# Patient Record
Sex: Male | Born: 1970 | Race: White | Hispanic: No | Marital: Married | Smoking: Never smoker
Health system: Southern US, Community
[De-identification: ages and names within clinical notes are randomized; demographics above are authoritative.]

## PROBLEM LIST (undated history)

## (undated) DIAGNOSIS — Z889 Allergy status to unspecified drugs, medicaments and biological substances status: Secondary | ICD-10-CM

## (undated) HISTORY — DX: Allergy status to unspecified drugs, medicaments and biological substances: Z88.9

---

## 2013-03-27 ENCOUNTER — Ambulatory Visit: Payer: Self-pay | Admitting: Unknown Physician Specialty

## 2013-04-30 ENCOUNTER — Encounter: Payer: Self-pay | Admitting: Pulmonary Disease

## 2013-04-30 ENCOUNTER — Ambulatory Visit (INDEPENDENT_AMBULATORY_CARE_PROVIDER_SITE_OTHER): Payer: BC Managed Care – PPO | Admitting: Pulmonary Disease

## 2013-04-30 VITALS — BP 120/80 | HR 79 | Temp 98.6°F | Ht 72.0 in | Wt 271.0 lb

## 2013-04-30 DIAGNOSIS — R05 Cough: Secondary | ICD-10-CM

## 2013-04-30 NOTE — Patient Instructions (Signed)
When you get a cough: -take Delsym to help suppress the cough -take Chlortrimeton to help with the itchy sensation in your throat -take decongestants like phenylephrine or pseudophed to help with the nasal congestion, post nasal drip -use warm beverages, hard candies to help sooth your throat -elevate the head of your bed -follow the gastric reflux lifestyle modification sheet we gave you -try using an over the counter antacid like Pepcid  We can see you back as needed

## 2013-04-30 NOTE — Assessment & Plan Note (Signed)
Fortunately Ian Pratt's cough has resolved with conservative therapy.  I'm not really sure if the inhaled corticosteroids helped or not, especially since I see little evidence of asthma.    It seems likely that his cough is exacerbated by ongoing post nasal drip and I strongly suspect acid reflux since he is overweight and the cough is worse when he lies flat.  He likely also has a component of cyclical cough as well.  Plan: -when next cough develops, use hard candies, warm beverages, and cough suppressants immediately to sooth the throat and prevent cyclical cough -GERD lifestyle modification, weight loss -f/u with me prn

## 2013-04-30 NOTE — Progress Notes (Signed)
Subjective:    Patient ID: Ian Pratt, male    DOB: Jul 14, 1971, 42 y.o.   MRN: [id number]  HPI  42 y/o male with a past medical history significant for allergic rhinitis on allergy shots he comes to our clinic today for evaluation of cough. He states that as a child he had no respiratory problems. He previously used smokeless tobacco but has never smoked. He quit using smokeless tobacco over one year ago. He is not taking medications. On 2 separate occasions in the last year he has developed an upper respiratory infection after heavy dust exposure. This is lead to significant postnasal drip and hoarseness and cough which has lasted for quite some time. He's been diagnosis of bronchitis on both occasions and treated with antibiotics and/or prednisone. Most recently he was treated with an inhaled corticosteroid. The cough apparently lasted for 4-6 weeks and now has completely resolved. He says he comes her today to "just get checked out to make sure his lungs are OK". He states that he does not have acid reflux and when he is doing well he doesn't have significant cough or postnasal drip. When the cough was bad it was dry and was significantly worse when he was lying flat. Is associated with increased postnasal drip. It did not change after eating. Tussionex helped quite a bit.     Past Medical History  Diagnosis Date  . Multiple allergies      No family history on file.   History   Social History  . Marital Status: Married    Spouse Name: N/A    Number of Children: N/A  . Years of Education: N/A   Occupational History  . Engineer, structural    Social History Main Topics  . Smoking status: Never Smoker   . Smokeless tobacco: Never Used  . Alcohol Use: No  . Drug Use: No  . Sexually Active: Not on file   Other Topics Concern  . Not on file   Social History Narrative  . No narrative on file     No Known Allergies   No outpatient prescriptions prior to visit.   No  facility-administered medications prior to visit.      Review of Systems  Constitutional: Negative for fever, chills, activity change and appetite change.  HENT: Negative for hearing loss, ear pain, congestion, rhinorrhea, sneezing, neck pain, neck stiffness, postnasal drip and sinus pressure.   Eyes: Negative for redness, itching and visual disturbance.  Respiratory: Positive for cough. Negative for chest tightness, shortness of breath and wheezing.   Cardiovascular: Negative for chest pain, palpitations and leg swelling.  Gastrointestinal: Negative for nausea, vomiting, abdominal pain, diarrhea, constipation, blood in stool and abdominal distention.  Musculoskeletal: Negative for myalgias, joint swelling, arthralgias and gait problem.  Skin: Negative for rash.  Neurological: Negative for dizziness, light-headedness, numbness and headaches.  Hematological: Does not bruise/bleed easily.  Psychiatric/Behavioral: Negative for confusion and dysphoric mood.       Objective:   Physical Exam Filed Vitals:   04/30/13 1406  BP: 120/80  Pulse: 79  Temp: 98.6 F (37 C)  TempSrc: Oral  Height: 6' (1.829 m)  Weight: 271 lb (122.925 kg)  SpO2: 98%   Gen: overweight but well appearing, no acute distress HEENT: NCAT, PERRL, EOMi, OP clear, neck supple without masses PULM: CTA B CV: RRR, no mgr, no JVD AB: BS+, soft, nontender, no hsm Ext: warm, no edema, no clubbing, no cyanosis Derm: no rash or skin breakdown Neuro: A&Ox4,  CN II-XII intact, strength 5/5 in all 4 extremities   04/30/2013 Spirometry > no obstruction, FVC low 03/27/2013 CXR > normal     Assessment & Plan:   Cough Fortunately Tramain's cough has resolved with conservative therapy.  I'm not really sure if the inhaled corticosteroids helped or not, especially since I see little evidence of asthma.    It seems likely that his cough is exacerbated by ongoing post nasal drip and I strongly suspect acid reflux since he is  overweight and the cough is worse when he lies flat.  He likely also has a component of cyclical cough as well.  Plan: -when next cough develops, use hard candies, warm beverages, and cough suppressants immediately to sooth the throat and prevent cyclical cough -GERD lifestyle modification, weight loss -f/u with me prn   Updated Medication List Outpatient Encounter Prescriptions as of 04/30/2013  Medication Sig Dispense Refill  . cetirizine (ZYRTEC) 10 MG tablet Take 10 mg by mouth daily.      . Flunisolide HFA (AEROSPAN) 80 MCG/ACT AERS Inhale 2 sprays into the lungs 2 (two) times daily.      . fluticasone (FLONASE) 50 MCG/ACT nasal spray Place 2 sprays into the nose daily.      Marland Kitchen UNABLE TO FIND Med Name: Allergy inj once per wk       No facility-administered encounter medications on file as of 04/30/2013.

## 2014-07-23 IMAGING — CR DG CHEST 2V
1 series · 2 of 2 positions shown · non-contrast
Comparison: none

REASON FOR EXAM: cough
COMMENTS:

[Series 1: w chest pa · 0.14mm/px · 2 of 2 slices shown]
[im 1/2]
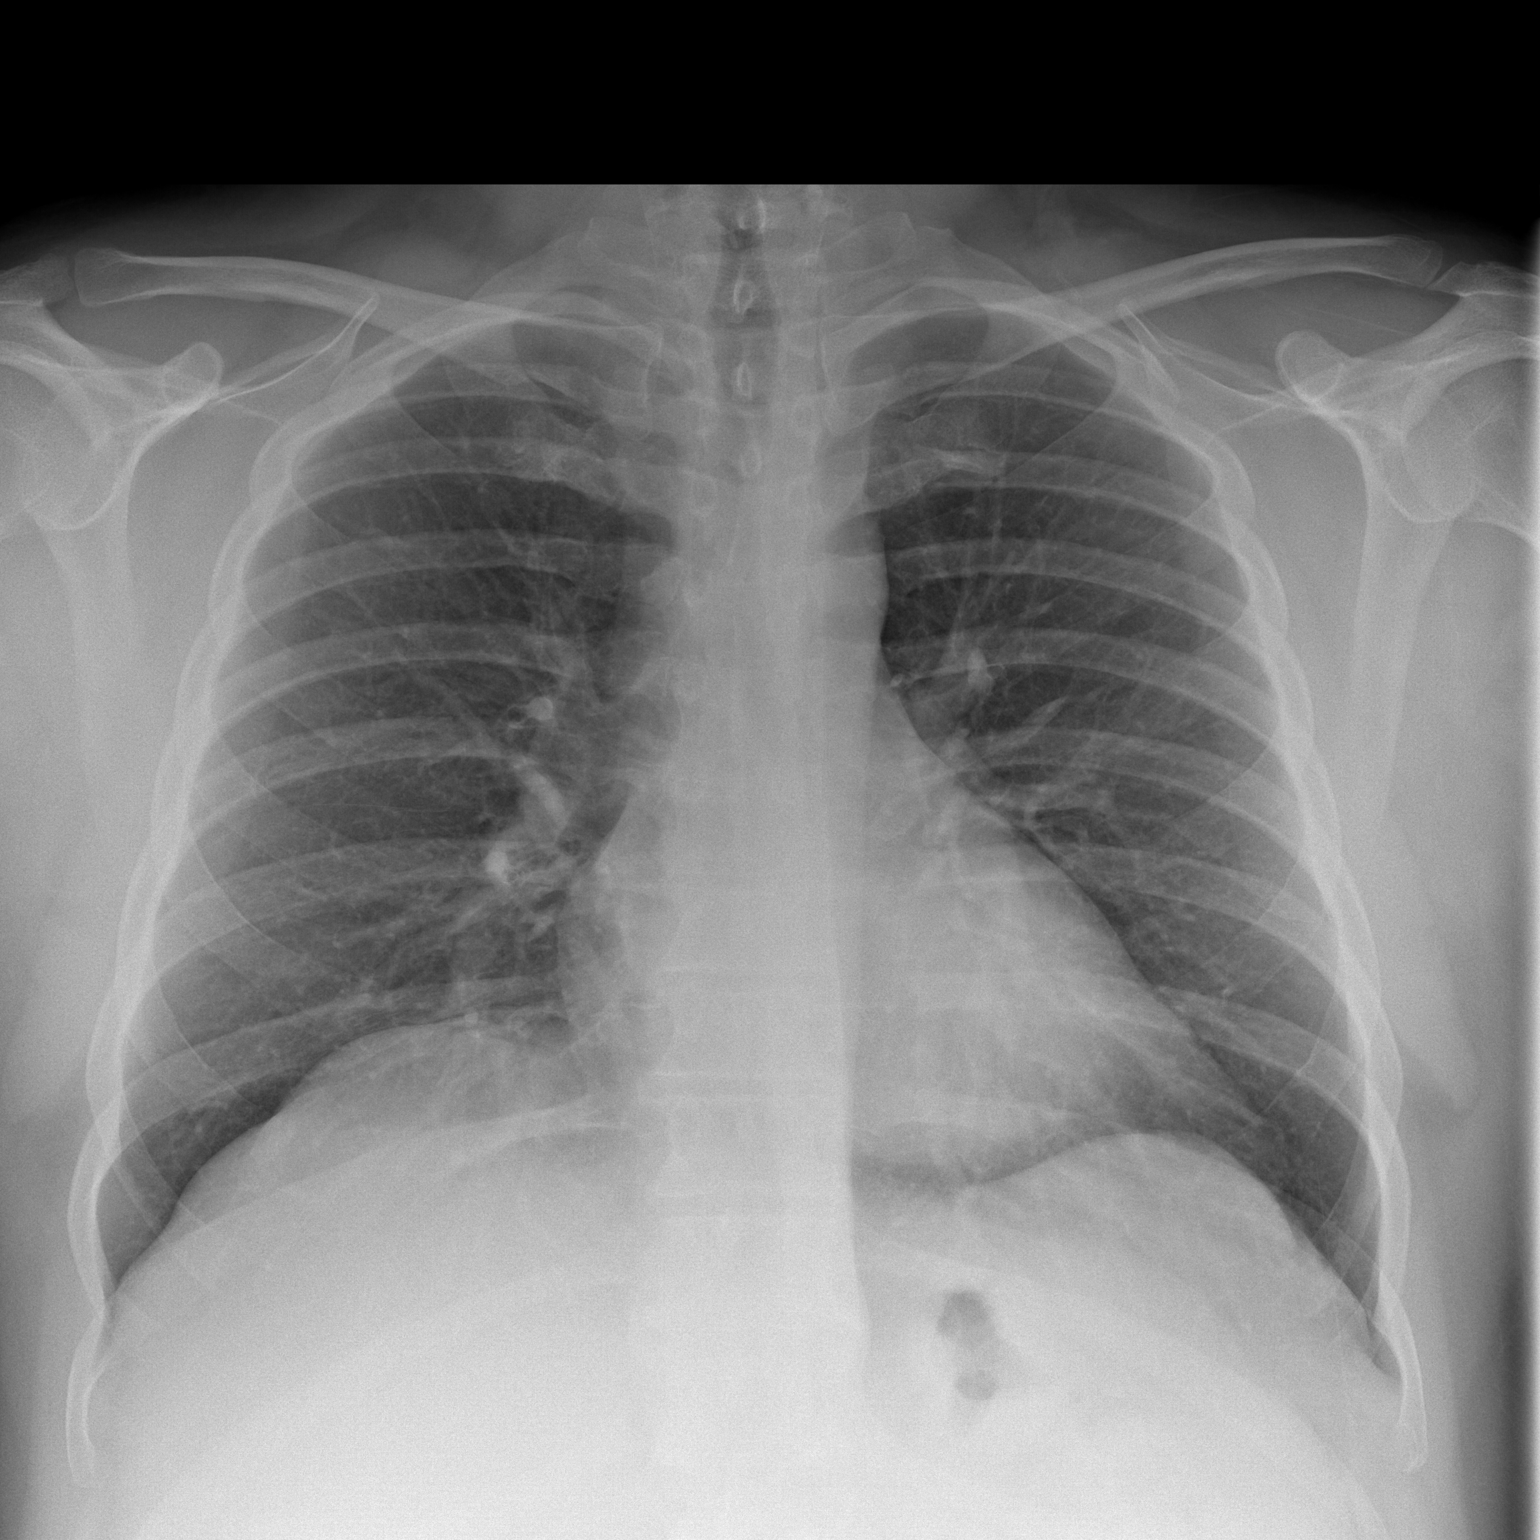
[im 2/2]
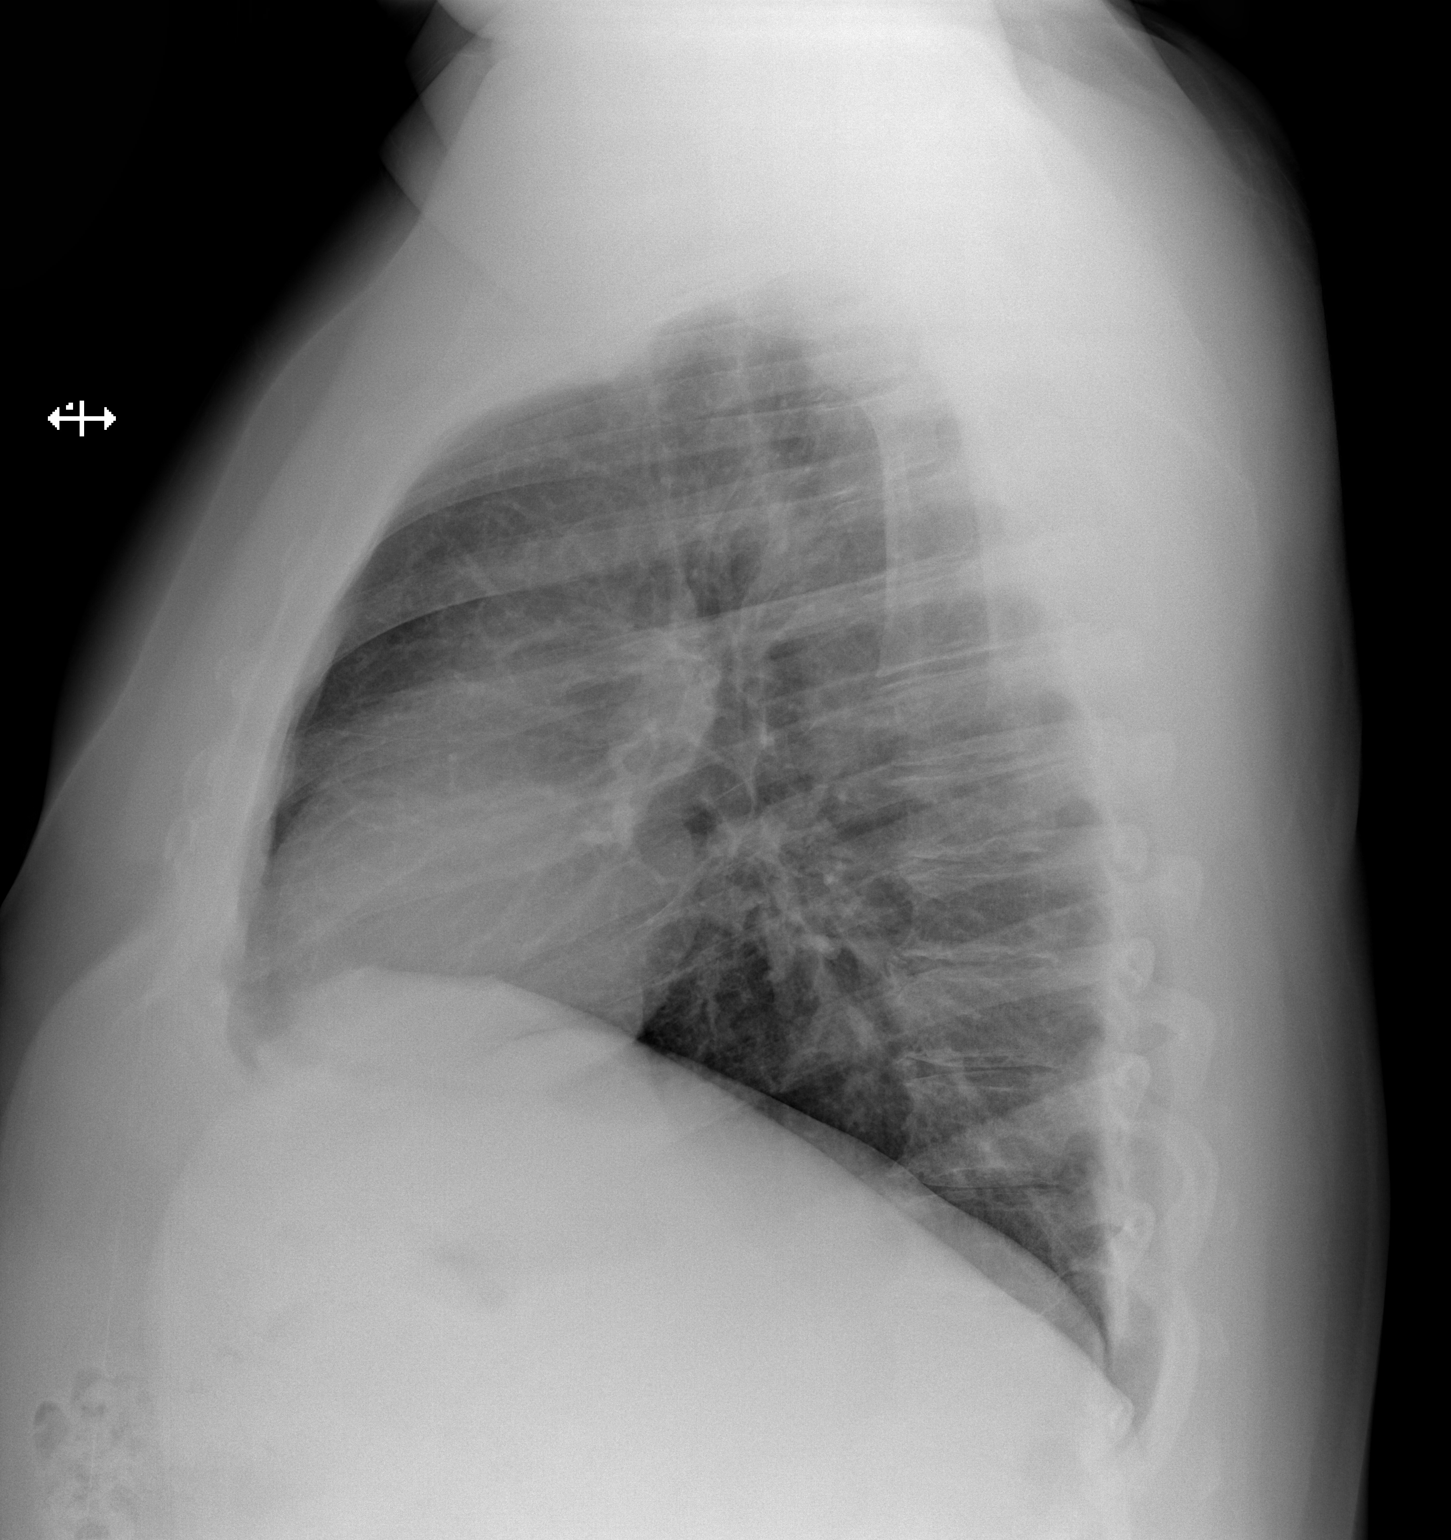

[2 of 2 positions shown; findings below may reference images not displayed]

PROCEDURE:     DXR - DXR CHEST PA (OR AP) AND LATERAL  - March 27, 2013 [DATE]

RESULT:     The lungs are well-expanded. There is no focal infiltrate. There
is no pleural effusion or pneumothorax or pneumomediastinum. The cardiac
silhouette is normal in size. The pulmonary vascularity is not engorged. The
trachea is midline. The bony thorax exhibits no acute abnormality.
IMPRESSION: There is no evidence of pneumonia nor other acute
cardiopulmonary abnormality.

[REDACTED]

## 2021-01-22 ENCOUNTER — Other Ambulatory Visit: Admission: RE | Admit: 2021-01-22 | Payer: Self-pay | Source: Ambulatory Visit

## 2021-01-25 ENCOUNTER — Encounter: Payer: Self-pay | Admitting: Emergency Medicine

## 2021-01-26 ENCOUNTER — Ambulatory Visit
Admission: RE | Admit: 2021-01-26 | Discharge: 2021-01-26 | Disposition: A | Discharge status: Home / Self Care | Payer: Managed Care, Other (non HMO) | Attending: Gastroenterology | Admitting: Gastroenterology

## 2021-01-26 ENCOUNTER — Ambulatory Visit: Payer: Managed Care, Other (non HMO) | Admitting: Anesthesiology

## 2021-01-26 ENCOUNTER — Encounter: Payer: Self-pay | Admitting: *Deleted

## 2021-01-26 ENCOUNTER — Encounter
Admission: RE | Disposition: A | Discharge status: Home / Self Care | Payer: Self-pay | Source: Home / Self Care | Attending: Gastroenterology

## 2021-01-26 ENCOUNTER — Other Ambulatory Visit: Payer: Self-pay

## 2021-01-26 DIAGNOSIS — K640 First degree hemorrhoids: Secondary | ICD-10-CM | POA: Insufficient documentation

## 2021-01-26 DIAGNOSIS — Z1211 Encounter for screening for malignant neoplasm of colon: Secondary | ICD-10-CM | POA: Insufficient documentation

## 2021-01-26 DIAGNOSIS — D124 Benign neoplasm of descending colon: Secondary | ICD-10-CM | POA: Insufficient documentation

## 2021-01-26 DIAGNOSIS — Z79899 Other long term (current) drug therapy: Secondary | ICD-10-CM | POA: Insufficient documentation

## 2021-01-26 HISTORY — PX: COLONOSCOPY WITH PROPOFOL: SHX5780

## 2021-01-26 SURGERY — COLONOSCOPY WITH PROPOFOL
Anesthesia: General

## 2021-01-26 MED ORDER — DEXMEDETOMIDINE (PRECEDEX) IN NS 20 MCG/5ML (4 MCG/ML) IV SYRINGE
PREFILLED_SYRINGE | INTRAVENOUS | Status: AC
  Filled 2021-01-26: qty 5

## 2021-01-26 MED ORDER — MIDAZOLAM HCL 2 MG/2ML IJ SOLN
INTRAMUSCULAR | Status: DC | PRN
  Administered 2021-01-26 (×3): 2 mg via INTRAVENOUS

## 2021-01-26 MED ORDER — MIDAZOLAM HCL 2 MG/2ML IJ SOLN
INTRAMUSCULAR | Status: AC
  Filled 2021-01-26: qty 2

## 2021-01-26 MED ORDER — FENTANYL CITRATE (PF) 100 MCG/2ML IJ SOLN
INTRAMUSCULAR | Status: DC | PRN
  Administered 2021-01-26 (×2): 50 ug via INTRAVENOUS

## 2021-01-26 MED ORDER — GLYCOPYRROLATE 0.2 MG/ML IJ SOLN
INTRAMUSCULAR | Status: DC | PRN
  Administered 2021-01-26: .2 mg via INTRAVENOUS

## 2021-01-26 MED ORDER — PROPOFOL 500 MG/50ML IV EMUL
INTRAVENOUS | Status: DC | PRN
  Administered 2021-01-26: 100 ug/kg/min via INTRAVENOUS

## 2021-01-26 MED ORDER — DEXMEDETOMIDINE (PRECEDEX) IN NS 20 MCG/5ML (4 MCG/ML) IV SYRINGE
PREFILLED_SYRINGE | INTRAVENOUS | Status: DC | PRN
  Administered 2021-01-26: 8 ug via INTRAVENOUS
  Administered 2021-01-26: 12 ug via INTRAVENOUS

## 2021-01-26 MED ORDER — GLYCOPYRROLATE 0.2 MG/ML IJ SOLN
INTRAMUSCULAR | Status: AC
  Filled 2021-01-26: qty 1

## 2021-01-26 MED ORDER — LIDOCAINE HCL (CARDIAC) PF 100 MG/5ML IV SOSY
PREFILLED_SYRINGE | INTRAVENOUS | Status: DC | PRN
  Administered 2021-01-26: 100 mg via INTRAVENOUS

## 2021-01-26 MED ORDER — PROPOFOL 500 MG/50ML IV EMUL
INTRAVENOUS | Status: AC
  Filled 2021-01-26: qty 50

## 2021-01-26 MED ORDER — FENTANYL CITRATE (PF) 100 MCG/2ML IJ SOLN
INTRAMUSCULAR | Status: AC
  Filled 2021-01-26: qty 2

## 2021-01-26 MED ORDER — PROPOFOL 10 MG/ML IV BOLUS
INTRAVENOUS | Status: DC | PRN
  Administered 2021-01-26: 50 mg via INTRAVENOUS

## 2021-01-26 MED ORDER — SODIUM CHLORIDE 0.9 % IV SOLN: INTRAVENOUS | Status: DC

## 2021-01-26 NOTE — Op Note (Signed)
[facility] Gastroenterology Patient Name: Ian Pratt Procedure Date: 01/26/2021 12:50 PM MRN: [id number] Account #: [id number] Date of Birth: [birth year] Admit Type: Outpatient Age: 50 Room: [facility] ENDO ROOM 1 Gender: Male Note Status: Finalized Procedure:             Colonoscopy Indications:           Screening for colorectal malignant neoplasm Providers:             Andrey Farmer MD, MD Referring MD:          Irven Easterly. Kary Kos, MD (Referring MD) Medicines:             Monitored Anesthesia Care Complications:         No immediate complications. Estimated blood loss:                         Minimal. Procedure:             Pre-Anesthesia Assessment:                        - Prior to the procedure, a History and Physical was                         performed, and patient medications and allergies were                         reviewed. The patient is competent. The risks and                         benefits of the procedure and the sedation options and                         risks were discussed with the patient. All questions                         were answered and informed consent was obtained.                         Patient identification and proposed procedure were                         verified by the physician, the nurse, the anesthetist                         and the technician in the endoscopy suite. Mental                         Status Examination: alert and oriented. Airway                         Examination: normal oropharyngeal airway and neck                         mobility. Respiratory Examination: clear to                         auscultation. CV Examination: normal. Prophylactic  Antibiotics: The patient does not require prophylactic                         antibiotics. Prior Anticoagulants: The patient has                         taken no previous anticoagulant or antiplatelet                         agents. ASA Grade  Assessment: II - A patient with mild                         systemic disease. After reviewing the risks and                         benefits, the patient was deemed in satisfactory                         condition to undergo the procedure. The anesthesia                         plan was to use monitored anesthesia care (MAC).                         Immediately prior to administration of medications,                         the patient was re-assessed for adequacy to receive                         sedatives. The heart rate, respiratory rate, oxygen                         saturations, blood pressure, adequacy of pulmonary                         ventilation, and response to care were monitored                         throughout the procedure. The physical status of the                         patient was re-assessed after the procedure.                        After obtaining informed consent, the colonoscope was                         passed under direct vision. Throughout the procedure,                         the patient's blood pressure, pulse, and oxygen                         saturations were monitored continuously. The                         Colonoscope was introduced through the anus and  advanced to the the cecum, identified by appendiceal                         orifice and ileocecal valve. The colonoscopy was                         performed without difficulty. The patient tolerated                         the procedure well. The quality of the bowel                         preparation was good. Findings:      The perianal and digital rectal examinations were normal.      A 1 mm polyp was found in the splenic flexure. The polyp was sessile.       The polyp was removed with a jumbo cold forceps. Resection and retrieval       were complete. Estimated blood loss was minimal.      A 7 mm polyp was found in the descending colon. The polyp was sessile.        The polyp was removed with a cold snare. Resection and retrieval were       complete. Estimated blood loss was minimal.      Internal hemorrhoids were found during retroflexion. The hemorrhoids       were Grade I (internal hemorrhoids that do not prolapse).      The exam was otherwise without abnormality on direct and retroflexion       views. Impression:            - One 1 mm polyp at the splenic flexure, removed with                         a jumbo cold forceps. Resected and retrieved.                        - One 7 mm polyp in the descending colon, removed with                         a cold snare. Resected and retrieved.                        - Internal hemorrhoids.                        - The examination was otherwise normal on direct and                         retroflexion views. Recommendation:        - Discharge patient to home.                        - Resume previous diet.                        - Continue present medications.                        - Await pathology results.                        -  Repeat colonoscopy for surveillance based on                         pathology results.                        - Return to referring physician as previously                         scheduled. Procedure Code(s):     --- Professional ---                        [phone], Colonoscopy, flexible; with removal of                         tumor(s), polyp(s), or other lesion(s) by snare                         technique                        45380, 19, Colonoscopy, flexible; with biopsy, single                         or multiple Diagnosis Code(s):     --- Professional ---                        Z12.11, Encounter for screening for malignant neoplasm                         of colon                        K63.5, Polyp of colon                        K64.0, First degree hemorrhoids CPT copyright 2019 American Medical [employer]. All rights reserved. The codes documented in this report  are preliminary and upon coder review may  be revised to meet current compliance requirements. Andrey Farmer MD, MD 01/26/2021 1:28:18 PM Number of Addenda: 0 Note Initiated On: 01/26/2021 12:50 PM Scope Withdrawal Time: 0 hours 8 minutes 26 seconds  Total Procedure Duration: 0 hours 18 minutes 35 seconds  Estimated Blood Loss:  Estimated blood loss was minimal.      [facility]

## 2021-01-26 NOTE — Interval H&P Note (Signed)
History and Physical Interval Note:  01/26/2021 12:45 PM  Ian Pratt  has presented today for surgery, with the diagnosis of COLON CANCER SCREENING.  The various methods of treatment have been discussed with the patient and family. After consideration of risks, benefits and other options for treatment, the patient has consented to  Procedure(s): COLONOSCOPY WITH PROPOFOL (N/A) as a surgical intervention.  The patient's history has been reviewed, patient examined, no change in status, stable for surgery.  I have reviewed the patient's chart and labs.  Questions were answered to the patient's satisfaction.     Lesly Rubenstein  Ok to proceed with colonoscopy

## 2021-01-26 NOTE — Transfer of Care (Signed)
Immediate Anesthesia Transfer of Care Note  Patient: Ian Pratt  Procedure(s) Performed: COLONOSCOPY WITH PROPOFOL (N/A )  Patient Location: PACU  Anesthesia Type:General  Level of Consciousness: sedated  Airway & Oxygen Therapy: Patient Spontanous Breathing and Patient connected to nasal cannula oxygen  Post-op Assessment: Report given to RN and Post -op Vital signs reviewed and stable  Post vital signs: Reviewed and stable  Last Vitals:  Vitals Value Taken Time  BP 89/50 01/26/21 1329  Temp 36.1 C 01/26/21 1328  Pulse 57 01/26/21 1331  Resp 12 01/26/21 1331  SpO2 96 % 01/26/21 1331  Vitals shown include unvalidated device data.  Last Pain:  Vitals:   01/26/21 1328  TempSrc: Temporal  PainSc: Asleep         Complications: No complications documented.

## 2021-01-26 NOTE — Anesthesia Postprocedure Evaluation (Signed)
Anesthesia Post Note  Patient: Ian Pratt  Procedure(s) Performed: COLONOSCOPY WITH PROPOFOL (N/A )  Patient location during evaluation: Endoscopy Anesthesia Type: General Level of consciousness: awake and alert and oriented Pain management: pain level controlled Vital Signs Assessment: post-procedure vital signs reviewed and stable Respiratory status: spontaneous breathing Cardiovascular status: blood pressure returned to baseline Anesthetic complications: no   No complications documented.   Last Vitals:  Vitals:   01/26/21 1219 01/26/21 1328  BP: (!) 134/94 (!) 89/50  Pulse: 64 (!) 58  Resp: 16 12  Temp: (!) 36.1 C (!) 36.1 C  SpO2: 99% 96%    Last Pain:  Vitals:   01/26/21 1328  TempSrc: Temporal  PainSc: Asleep                 Wren Pryce

## 2021-01-26 NOTE — Anesthesia Preprocedure Evaluation (Signed)
Anesthesia Evaluation  Patient identified by MRN, date of birth, ID band Patient awake    Reviewed: Allergy & Precautions, NPO status , Patient's Chart, lab work & pertinent test results  Airway Mallampati: II  TM Distance: >3 FB     Dental  (+) Teeth Intact   Pulmonary neg pulmonary ROS,    Pulmonary exam normal        Cardiovascular negative cardio ROS Normal cardiovascular exam     Neuro/Psych negative neurological ROS  negative psych ROS   GI/Hepatic negative GI ROS, Neg liver ROS,   Endo/Other  negative endocrine ROS  Renal/GU negative Renal ROS  negative genitourinary   Musculoskeletal negative musculoskeletal ROS (+)   Abdominal Normal abdominal exam  (+)   Peds negative pediatric ROS (+)  Hematology negative hematology ROS (+)   Anesthesia Other Findings Past Medical History: No date: Multiple allergies  Reproductive/Obstetrics                             Anesthesia Physical Anesthesia Plan  ASA: I  Anesthesia Plan: General   Post-op Pain Management:    Induction: Intravenous  PONV Risk Score and Plan: Propofol infusion  Airway Management Planned: Nasal Cannula  Additional Equipment:   Intra-op Plan:   Post-operative Plan:   Informed Consent: I have reviewed the patients History and Physical, chart, labs and discussed the procedure including the risks, benefits and alternatives for the proposed anesthesia with the patient or authorized representative who has indicated his/her understanding and acceptance.     Dental advisory given  Plan Discussed with: CRNA and Surgeon  Anesthesia Plan Comments:         Anesthesia Quick Evaluation

## 2021-01-26 NOTE — H&P (Signed)
Outpatient short stay form Pre-procedure 01/26/2021 12:43 PM Raylene Miyamoto MD, MPH  Primary Physician: Dr. Kary Kos  Reason for visit:  Screening  History of present illness:   50 y/o gentleman with no family history of GI malignancies here for screening colonoscopy. No blood thinners. No abdominal surgeries.    Current Facility-Administered Medications:  .  0.9 %  sodium chloride infusion, , Intravenous, Continuous, Adena Sima, Hilton Cork, MD, Last Rate: 20 mL/hr at 01/26/21 1222, New Bag at 01/26/21 1222  Medications Prior to Admission  Medication Sig Dispense Refill Last Dose  . cetirizine (ZYRTEC) 10 MG tablet Take 10 mg by mouth daily.   01/25/2021 at Unknown time  . Flunisolide HFA 80 MCG/ACT AERS Inhale 2 sprays into the lungs 2 (two) times daily.   Past Week at Unknown time  . fluticasone (FLONASE) 50 MCG/ACT nasal spray Place 2 sprays into the nose daily.   Past Week at Unknown time  . UNABLE TO FIND Med Name: Allergy inj once per wk        No Known Allergies   Past Medical History:  Diagnosis Date  . Multiple allergies     Review of systems:  Otherwise negative.    Physical Exam  Gen: Alert, oriented. Appears stated age.  HEENT: PERRLA. Lungs: No respiratory distress CV: RRR Abd: soft, benign, no masses Ext: No edema    Planned procedures: Proceed with colonoscopy. The patient understands the nature of the planned procedure, indications, risks, alternatives and potential complications including but not limited to bleeding, infection, perforation, damage to internal organs and possible oversedation/side effects from anesthesia. The patient agrees and gives consent to proceed.  Please refer to procedure notes for findings, recommendations and patient disposition/instructions.     Raylene Miyamoto MD, MPH Gastroenterology 01/26/2021  12:43 PM

## 2021-01-27 LAB — SURGICAL PATHOLOGY

## 2021-01-28 ENCOUNTER — Encounter: Payer: Self-pay | Admitting: Gastroenterology
